# Patient Record
Sex: Female | Born: 1965 | Race: Black or African American | Hispanic: No | Marital: Married | Smoking: Never smoker
Health system: Southern US, Community
[De-identification: ages and names within clinical notes are randomized; demographics above are authoritative.]

---

## 2017-03-24 ENCOUNTER — Other Ambulatory Visit (HOSPITAL_COMMUNITY): Payer: Self-pay | Admitting: Physician Assistant

## 2017-03-24 DIAGNOSIS — H905 Unspecified sensorineural hearing loss: Secondary | ICD-10-CM

## 2017-03-24 DIAGNOSIS — H903 Sensorineural hearing loss, bilateral: Secondary | ICD-10-CM

## 2017-03-30 ENCOUNTER — Ambulatory Visit (HOSPITAL_COMMUNITY)
Admission: RE | Admit: 2017-03-30 | Discharge: 2017-03-30 | Disposition: A | Discharge status: Home / Self Care | Payer: 59 | Source: Ambulatory Visit | Attending: Physician Assistant | Admitting: Physician Assistant

## 2017-03-30 DIAGNOSIS — H905 Unspecified sensorineural hearing loss: Secondary | ICD-10-CM | POA: Insufficient documentation

## 2017-03-30 DIAGNOSIS — H903 Sensorineural hearing loss, bilateral: Secondary | ICD-10-CM

## 2017-03-30 LAB — POCT I-STAT CREATININE: Creatinine, Ser: 0.9 mg/dL (ref 0.44–1.00)

## 2017-03-30 MED ORDER — GADOBENATE DIMEGLUMINE 529 MG/ML IV SOLN
15.0000 mL | Freq: Once | INTRAVENOUS | Status: AC | PRN
  Administered 2017-03-30: 15 mL via INTRAVENOUS

## 2018-11-20 IMAGING — MR MR HEAD WO/W CM
7 of 16 series · 28 of 48 positions shown · IV contrast (multihance)
Comparison: None.

CLINICAL DATA: Abnormal sound left ear 8 weeks duration.

EXAM:
MRI HEAD WITHOUT AND WITH CONTRAST
TECHNIQUE: Multiplanar, multiecho pulse sequences of the brain and surrounding
structures were obtained without and with intravenous contrast.
CONTRAST:  15mL MULTIHANCE GADOBENATE DIMEGLUMINE 529 MG/ML IV SOLN

[Series 3: DWI · axial · 3.0mm · 0.69mm/px · z∈[-137,+23]mm · 5 of 55 slices shown (1 of 4)]
[im 1/55]
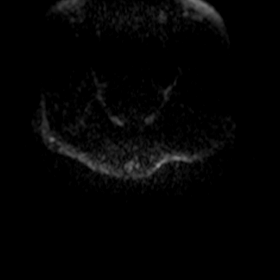
[im 14/55]
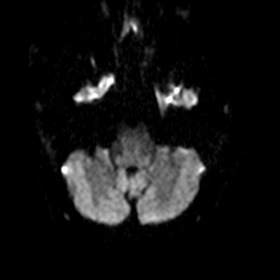
[im 28/55]
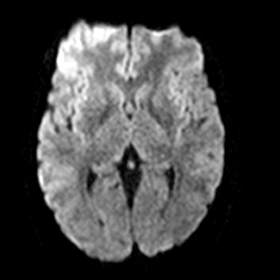
[im 41/55]
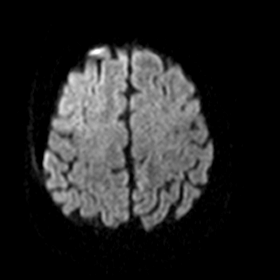
[im 55/55]
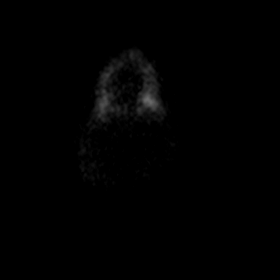

[Series 4: DWI · axial · 3.0mm · 0.73mm/px · z∈[-139,+22]mm · 5 of 55 slices shown (2 of 4)]
[im 1/55]
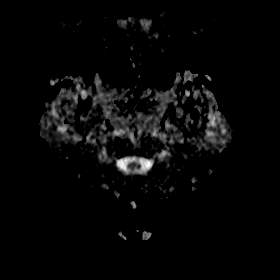
[im 14/55]
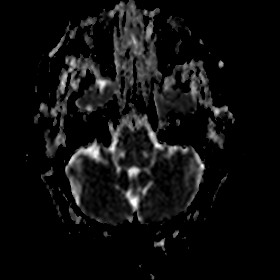
[im 28/55]
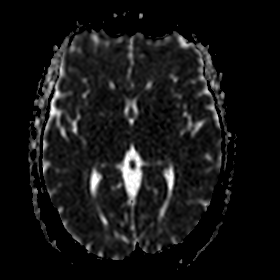
[im 41/55]
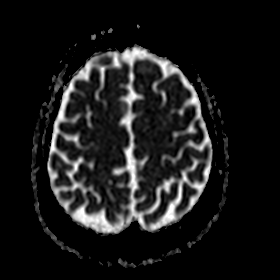
[im 55/55]
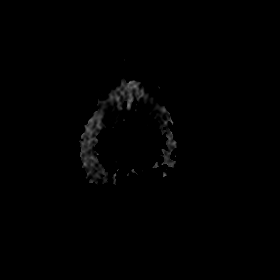

[Series 5: DWI · coronal · 5.0mm · 0.43mm/px · 3 of 34 slices shown (3 of 4)]
[im 1/34]
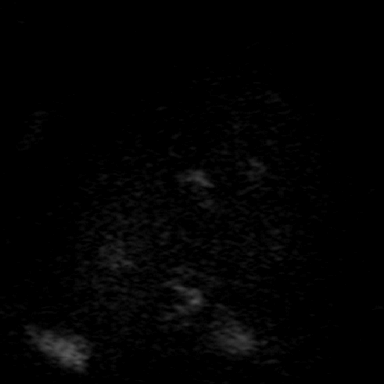
[im 17/34]
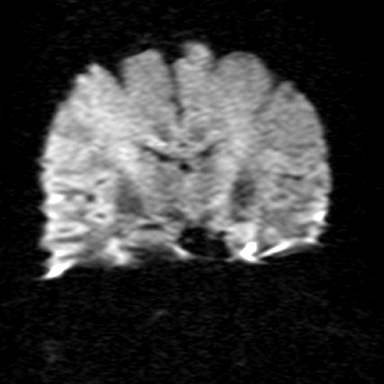
[im 34/34]
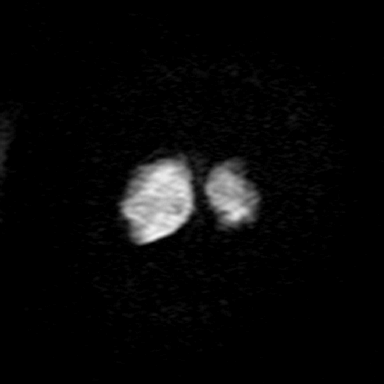

[Series 6: DWI · coronal · 5.0mm · 0.47mm/px · 3 of 34 slices shown (4 of 4)]
[im 1/34]
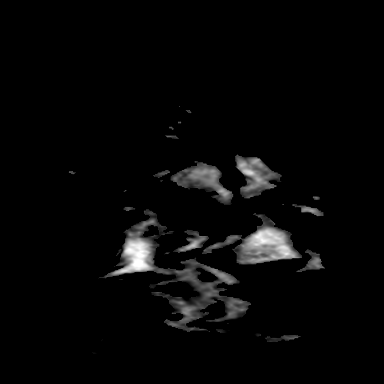
[im 17/34]
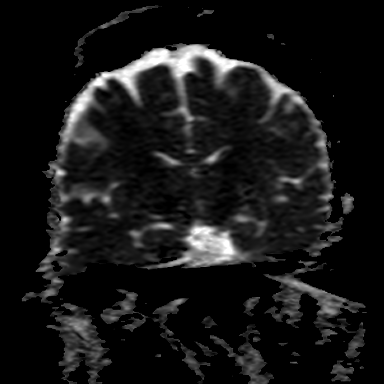
[im 34/34]
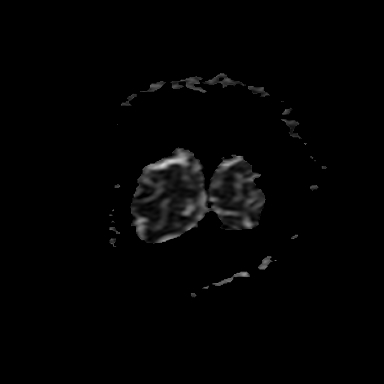

[Series 7: T2 · axial · 5.0mm · 0.41mm/px · 1 of 23 slices shown]
[im 1/23]
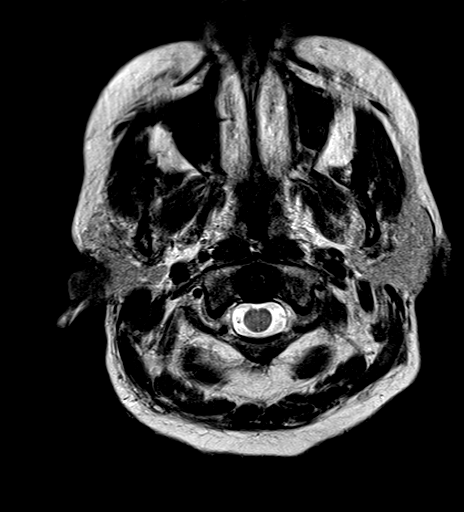

[Series 8: FLAIR · axial · 3.0mm · 0.35mm/px · z∈[-112,+26]mm · 4 of 47 slices shown]
[im 1/47]
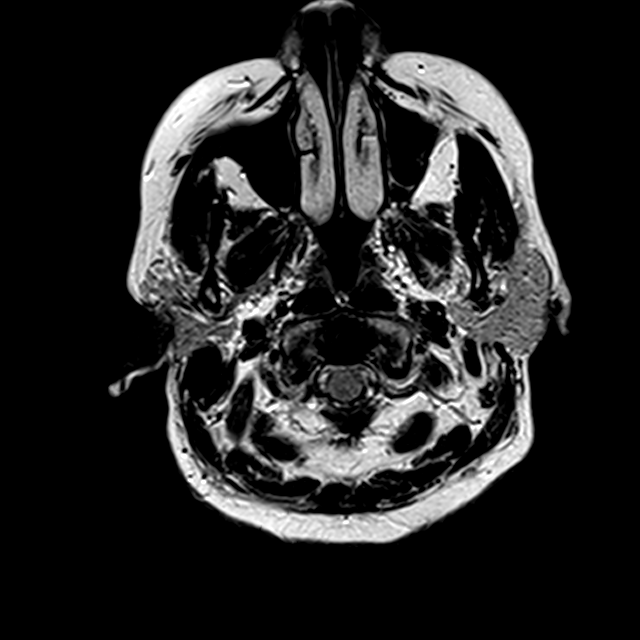
[im 16/47]
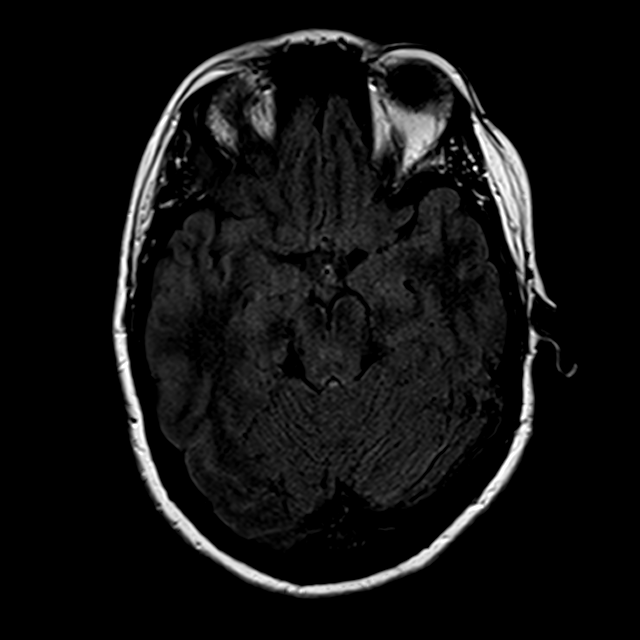
[im 31/47]
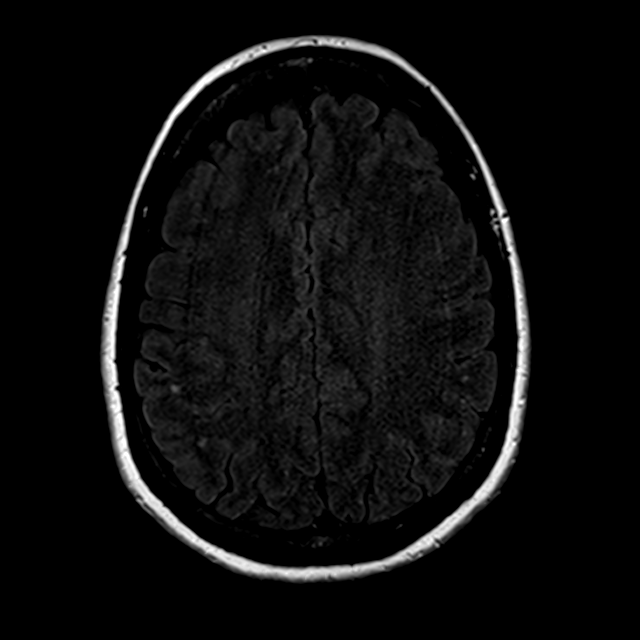
[im 47/47]
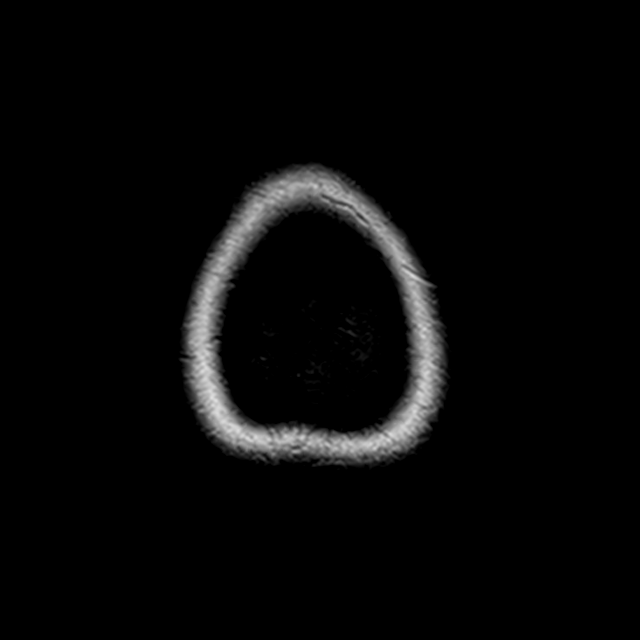

[Series 15: T1 post-contrast · axial · 2.0mm · 0.43mm/px · z∈[-118,+30]mm · 7 of 75 slices shown]
[im 1/75]
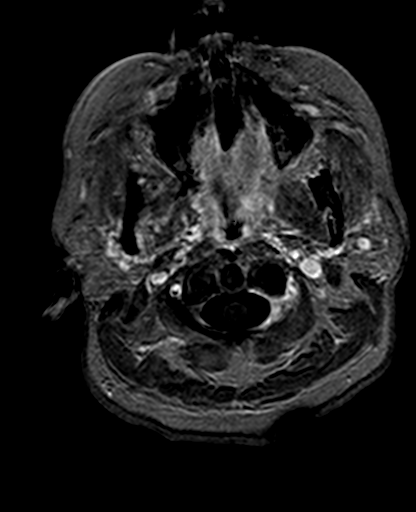
[im 13/75]
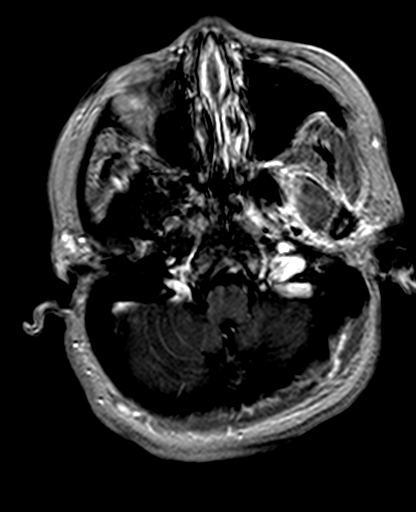
[im 25/75]
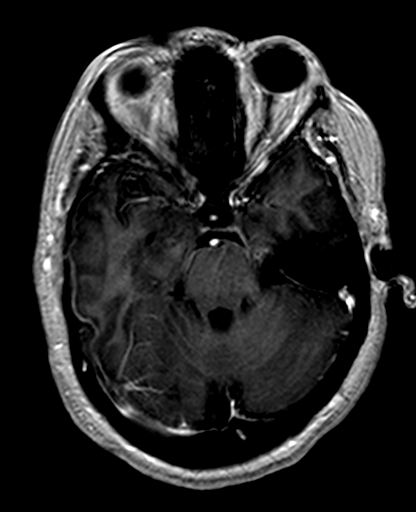
[im 38/75]
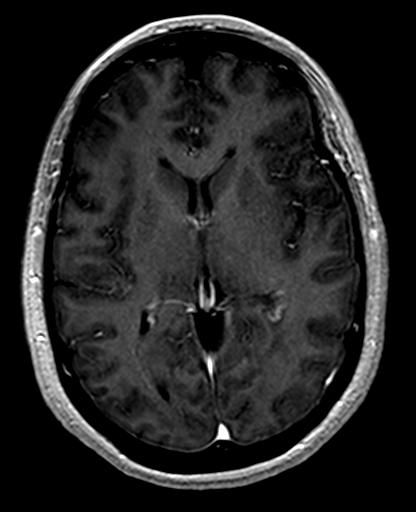
[im 50/75]
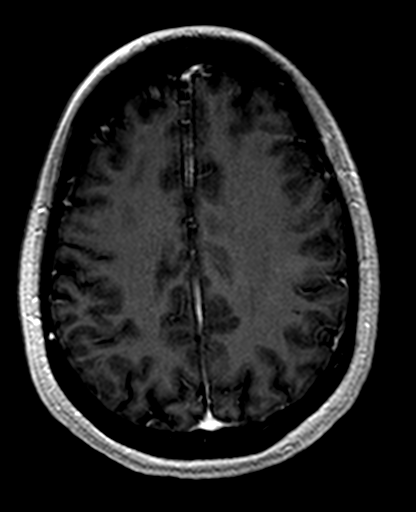
[im 62/75]
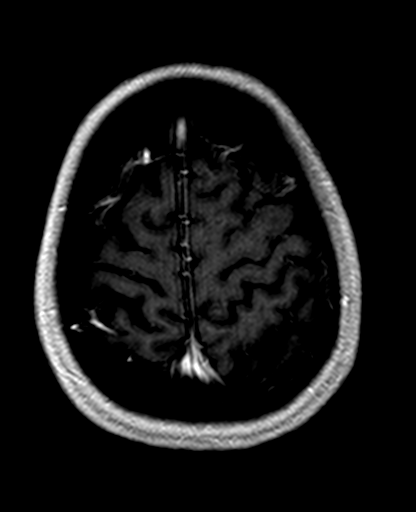
[im 75/75]
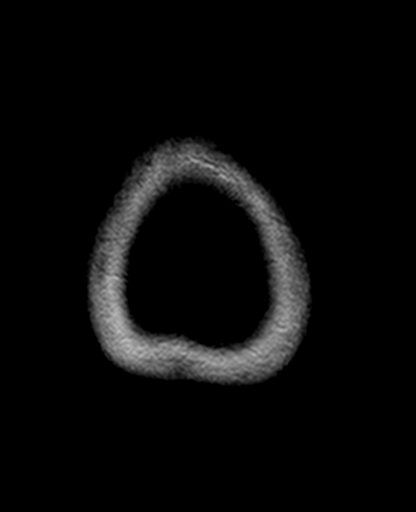

[28 of 48 positions shown; findings below may reference images not displayed]

FINDINGS: Brain: The study suffers from some motion degradation. Allowing for
that, the brain appears normal except for a few punctate foci of T2
and FLAIR signal within the subcortical white matter, probably not
significant. No evidence of generalized atrophy or large vessel
territory insult. No mass lesion, hemorrhage, hydrocephalus or
extra-axial collection. CP angle regions appear normal. Seventh and
eighth nerve complexes appear normal. No vestibular schwannoma. No
enhancing neuritis. The patient does show arachnoid herniation into
the sella with sellar enlargement. Usually, this is a normal
variant, but can be seen in cases of intracranial
hypertension/pseudo tumor.

Vascular: Major vessels at the base of the brain show flow.

Skull and upper cervical spine: Negative

Sinuses/Orbits: Clear sinuses. Orbits negative. No fluid in the
middle ears or mastoids.

Other: None
IMPRESSION: No definite cause of the presenting symptoms is identified. No
vestibular schwannoma. No mastoid or middle ear fluid seen on the
left. Inner ear structures appear within normal limits by MRI.

The patient does have arachnoid herniation into the sella/empty
sella. This is usually a normal variant, but can be seen in cases of
intracranial hypertension/pseudotumor.

## 2019-05-20 ENCOUNTER — Other Ambulatory Visit: Payer: Self-pay

## 2019-05-20 DIAGNOSIS — Z20822 Contact with and (suspected) exposure to covid-19: Secondary | ICD-10-CM

## 2019-05-22 ENCOUNTER — Telehealth: Payer: Self-pay

## 2019-05-22 NOTE — Telephone Encounter (Signed)
Pt called for covid results. Pt informed results are not yet back. Link to enroll to MyChart sent to pt via email.

## 2019-05-23 ENCOUNTER — Telehealth: Payer: Self-pay | Admitting: *Deleted

## 2019-05-23 LAB — NOVEL CORONAVIRUS, NAA: SARS-CoV-2, NAA: NOT DETECTED

## 2019-05-23 NOTE — Telephone Encounter (Signed)
Pt calling to verify covid results; negative. Pt verbalizes understanding.

## 2019-09-02 ENCOUNTER — Other Ambulatory Visit: Payer: Self-pay

## 2019-09-02 ENCOUNTER — Ambulatory Visit: Payer: 59 | Attending: Internal Medicine

## 2019-09-02 DIAGNOSIS — Z23 Encounter for immunization: Secondary | ICD-10-CM | POA: Insufficient documentation

## 2019-09-02 NOTE — Progress Notes (Signed)
   Covid-19 Vaccination Clinic  Name:  Peggy Duarte    MRN: [id number] DOB: [birth year]  09/02/2019  [name] was observed post Covid-19 immunization for 15 minutes without incident. She was provided with Vaccine Information Sheet and instruction to access the V-Safe system.   [name] was instructed to call 911 with any severe reactions post vaccine: Marland Kitchen Difficulty breathing  . Swelling of face and throat  . A fast heartbeat  . A bad rash all over body  . Dizziness and weakness   Immunizations Administered    Name Date Dose VIS Date Route   Moderna COVID-19 Vaccine 09/02/2019  8:43 AM 0.5 mL 05/31/2019 Intramuscular   Manufacturer: Moderna   Lot: [id number]   NDC: 81448-185-63

## 2019-10-04 ENCOUNTER — Ambulatory Visit: Payer: 59 | Attending: Internal Medicine

## 2019-10-04 DIAGNOSIS — Z23 Encounter for immunization: Secondary | ICD-10-CM

## 2019-10-04 NOTE — Progress Notes (Signed)
   Covid-19 Vaccination Clinic  Name:  Peggy Duarte    MRN: [id number] DOB: [birth year]  10/04/2019  [name] was observed post Covid-19 immunization for 15 minutes without incident. She was provided with Vaccine Information Sheet and instruction to access the V-Safe system.   [name] was instructed to call 911 with any severe reactions post vaccine: Marland Kitchen Difficulty breathing  . Swelling of face and throat  . A fast heartbeat  . A bad rash all over body  . Dizziness and weakness   Immunizations Administered    Name Date Dose VIS Date Route   Moderna COVID-19 Vaccine 10/04/2019  9:29 AM 0.5 mL 05/31/2019 Intramuscular   Manufacturer: Moderna   Lot: [id number]   NDC: 74718-550-15

## 2019-11-25 LAB — TSH: TSH: 6.32 — AB (ref <=5.90)

## 2020-01-04 ENCOUNTER — Encounter: Payer: Self-pay | Admitting: Endocrinology

## 2020-01-04 LAB — T4: T4,Free (Direct): 0.9

## 2020-01-04 LAB — TSH: TSH: 5.89

## 2020-01-31 ENCOUNTER — Ambulatory Visit: Payer: 59 | Admitting: Endocrinology

## 2020-02-21 ENCOUNTER — Other Ambulatory Visit: Payer: Self-pay

## 2020-02-21 ENCOUNTER — Ambulatory Visit (INDEPENDENT_AMBULATORY_CARE_PROVIDER_SITE_OTHER): Payer: 59 | Admitting: Endocrinology

## 2020-02-21 ENCOUNTER — Encounter: Payer: Self-pay | Admitting: Endocrinology

## 2020-02-21 DIAGNOSIS — E039 Hypothyroidism, unspecified: Secondary | ICD-10-CM | POA: Diagnosis not present

## 2020-02-21 MED ORDER — LEVOTHYROXINE SODIUM 50 MCG PO TABS: 50.0000 ug | ORAL_TABLET | Freq: Every day | ORAL | 3 refills | Status: DC

## 2020-02-21 NOTE — Progress Notes (Signed)
Subjective:    Patient ID: Peggy Duarte, female    DOB: [birth year], 54 y.o.   MRN: [id number]  HPI Pt is referred by Dr Sherril Croon, for hypothyroidism.  Pt reports hypothyroidism was dx'ed in 2021.  She has been on synthroid since dx.  she has never taken kelp or any other type of non-prescribed thyroid product.  she has never had thyroid imaging.  She has never had thyroid surgery, or XRT to the neck.  she has never been on amiodarone or lithium.  Main symptom is weight gain.   No past medical history on file.   Social History   Socioeconomic History  . Marital status: Married    Spouse name: Not on file  . Number of children: Not on file  . Years of education: Not on file  . Highest education level: Not on file  Occupational History  . Not on file  Tobacco Use  . Smoking status: Never Smoker  . Smokeless tobacco: Never Used  Substance and Sexual Activity  . Alcohol use: Not on file  . Drug use: Not on file  . Sexual activity: Not on file  Other Topics Concern  . Not on file  Social History Narrative  . Not on file   Social Determinants of Health   Financial Resource Strain:   . Difficulty of Paying Living Expenses: Not on file  Food Insecurity:   . Worried About Programme researcher, broadcasting/film/video in the Last Year: Not on file  . Ran Out of Food in the Last Year: Not on file  Transportation Needs:   . Lack of Transportation (Medical): Not on file  . Lack of Transportation (Non-Medical): Not on file  Physical Activity:   . Days of Exercise per Week: Not on file  . Minutes of Exercise per Session: Not on file  Stress:   . Feeling of Stress : Not on file  Social Connections:   . Frequency of Communication with Friends and Family: Not on file  . Frequency of Social Gatherings with Friends and Family: Not on file  . Attends Religious Services: Not on file  . Active Member of Clubs or Organizations: Not on file  . Attends Banker Meetings: Not on file  . Marital  Status: Not on file  Intimate Partner Violence:   . Fear of Current or Ex-Partner: Not on file  . Emotionally Abused: Not on file  . Physically Abused: Not on file  . Sexually Abused: Not on file    Current Outpatient Medications on File Prior to Visit  Medication Sig Dispense Refill  . Vitamin D, Ergocalciferol, (DRISDOL) 1.25 MG (50000 UNIT) CAPS capsule Take 50,000 Units by mouth See admin instructions. Take 3 tablets weekly     No current facility-administered medications on file prior to visit.    No Known Allergies  Family History  Problem Relation Age of Onset  . Thyroid disease Sister     BP 138/80   Pulse 78   Ht 5\' 3"  (1.6 m)   Wt 180 lb 6.4 oz (81.8 kg)   BMI 31.96 kg/m    Review of Systems denies depression, muscle cramps, constipation, numbness, and dry skin.  She has cold intolerance.       Objective:   Physical Exam VS: see vs page GEN: no distress HEAD: head: no deformity eyes: no periorbital swelling, no proptosis external nose and ears are normal NECK: supple, thyroid is not enlarged.   CHEST WALL: no deformity  LUNGS: clear to auscultation CV: reg rate and rhythm, no murmur.  MUSCULOSKELETAL: muscle bulk and strength are grossly normal.  no obvious joint swelling.  gait is normal and steady EXTEMITIES: no deformity.  no leg edema PULSES: no carotid bruit NEURO:  cn 2-12 grossly intact.   readily moves all 4's.  sensation is intact to touch on all 4's SKIN:  Normal texture and temperature.  No rash or suspicious lesion is visible.   NODES:  None palpable at the neck PSYCH: alert, well-oriented.  Does not appear anxious nor depressed.     Lab Results  Component Value Date   TSH 6.32 (A) 11/25/2019   I have reviewed outside records, and summarized: Pt was noted to have elevated TSH, and referred here.  Pt denied sxs.  She was started on synthroid.  Wellness was also addressed.      Assessment & Plan:  Hypothyroidism, new to me.   Uncontrolled.    Patient Instructions  I have sent a prescription to your pharmacy, to increase the thyroid pill. Please redo the blood tests in 1 month, [facility] [place]. Please come back for a follow-up appointment in 6 months.       Hypothyroidism  Hypothyroidism is when the thyroid gland does not make enough of certain hormones (it is underactive). The thyroid gland is a small gland located in the lower front part of the neck, just in front of the windpipe (trachea). This gland makes hormones that help control how the body uses food for energy (metabolism) as well as how the heart and brain function. These hormones also play a role in keeping your bones strong. When the thyroid is underactive, it produces too little of the hormones thyroxine (T4) and triiodothyronine (T3). What are the causes? This condition may be caused by:  Hashimoto's disease. This is a disease in which the body's disease-fighting system (immune system) attacks the thyroid gland. This is the most common cause.  Viral infections.  Pregnancy.  Certain medicines.  Birth defects.  Past radiation treatments to the head or neck for cancer.  Past treatment with radioactive iodine.  Past exposure to radiation in the environment.  Past surgical removal of part or all of the thyroid.  Problems with a gland in the center of the brain (pituitary gland).  Lack of enough iodine in the diet. What increases the risk? You are more likely to develop this condition if:  You are female.  You have a family history of thyroid conditions.  You use a medicine called lithium.  You take medicines that affect the immune system (immunosuppressants). What are the signs or symptoms? Symptoms of this condition include:  Feeling as though you have no energy (lethargy).  Not being able to tolerate cold.  Weight gain that is not explained by a change in diet or exercise habits.  Lack of appetite.  Dry  skin.  Coarse hair.  Menstrual irregularity.  Slowing of thought processes.  Constipation.  Sadness or depression. How is this diagnosed? This condition may be diagnosed based on:  Your symptoms, your medical history, and a physical exam.  Blood tests. You may also have imaging tests, such as an ultrasound or MRI. How is this treated? This condition is treated with medicine that replaces the thyroid hormones that your body does not make. After you begin treatment, it may take several weeks for symptoms to go away. Follow these instructions at home:  Take over-the-counter and prescription medicines only as told by your  health care provider.  If you start taking any new medicines, tell your health care provider.  Keep all follow-up visits as told by your health care provider. This is important. ? As your condition improves, your dosage of thyroid hormone medicine may change. ? You will need to have blood tests regularly so that your health care provider can monitor your condition. Contact a health care provider if:  Your symptoms do not get better with treatment.  You are taking thyroid replacement medicine and you: ? Sweat a lot. ? Have tremors. ? Feel anxious. ? Lose weight rapidly. ? Cannot tolerate heat. ? Have emotional swings. ? Have diarrhea. ? Feel weak. Get help right away if you have:  Chest pain.  An irregular heartbeat.  A rapid heartbeat.  Difficulty breathing. Summary  Hypothyroidism is when the thyroid gland does not make enough of certain hormones (it is underactive).  When the thyroid is underactive, it produces too little of the hormones thyroxine (T4) and triiodothyronine (T3).  The most common cause is Hashimoto's disease, a disease in which the body's disease-fighting system (immune system) attacks the thyroid gland. The condition can also be caused by viral infections, medicine, pregnancy, or past radiation treatment to the head or  neck.  Symptoms may include weight gain, dry skin, constipation, feeling as though you do not have energy, and not being able to tolerate cold.  This condition is treated with medicine to replace the thyroid hormones that your body does not make. This information is not intended to replace advice given to you by your health care provider. Make sure you discuss any questions you have with your health care provider. Document Revised: 05/29/2017 Document Reviewed: 05/27/2017 Elsevier Patient Education  2020 [employer].

## 2020-02-21 NOTE — Patient Instructions (Addendum)
I have sent a prescription to your pharmacy, to increase the thyroid pill.  Please redo the blood tests in 1 month, [facility] [place].  Please come back for a follow-up appointment in 6 months.       Hypothyroidism  Hypothyroidism is when the thyroid gland does not make enough of certain hormones (it is underactive). The thyroid gland is a small gland located in the lower front part of the neck, just in front of the windpipe (trachea). This gland makes hormones that help control how the body uses food for energy (metabolism) as well as how the heart and brain function. These hormones also play a role in keeping your bones strong. When the thyroid is underactive, it produces too little of the hormones thyroxine (T4) and triiodothyronine (T3). What are the causes? This condition may be caused by:  Hashimoto's disease. This is a disease in which the body's disease-fighting system (immune system) attacks the thyroid gland. This is the most common cause.  Viral infections.  Pregnancy.  Certain medicines.  Birth defects.  Past radiation treatments to the head or neck for cancer.  Past treatment with radioactive iodine.  Past exposure to radiation in the environment.  Past surgical removal of part or all of the thyroid.  Problems with a gland in the center of the brain (pituitary gland).  Lack of enough iodine in the diet. What increases the risk? You are more likely to develop this condition if:  You are female.  You have a family history of thyroid conditions.  You use a medicine called lithium.  You take medicines that affect the immune system (immunosuppressants). What are the signs or symptoms? Symptoms of this condition include:  Feeling as though you have no energy (lethargy).  Not being able to tolerate cold.  Weight gain that is not explained by a change in diet or exercise habits.  Lack of appetite.  Dry skin.  Coarse hair.  Menstrual  irregularity.  Slowing of thought processes.  Constipation.  Sadness or depression. How is this diagnosed? This condition may be diagnosed based on:  Your symptoms, your medical history, and a physical exam.  Blood tests. You may also have imaging tests, such as an ultrasound or MRI. How is this treated? This condition is treated with medicine that replaces the thyroid hormones that your body does not make. After you begin treatment, it may take several weeks for symptoms to go away. Follow these instructions at home:  Take over-the-counter and prescription medicines only as told by your health care provider.  If you start taking any new medicines, tell your health care provider.  Keep all follow-up visits as told by your health care provider. This is important. ? As your condition improves, your dosage of thyroid hormone medicine may change. ? You will need to have blood tests regularly so that your health care provider can monitor your condition. Contact a health care provider if:  Your symptoms do not get better with treatment.  You are taking thyroid replacement medicine and you: ? Sweat a lot. ? Have tremors. ? Feel anxious. ? Lose weight rapidly. ? Cannot tolerate heat. ? Have emotional swings. ? Have diarrhea. ? Feel weak. Get help right away if you have:  Chest pain.  An irregular heartbeat.  A rapid heartbeat.  Difficulty breathing. Summary  Hypothyroidism is when the thyroid gland does not make enough of certain hormones (it is underactive).  When the thyroid is underactive, it produces too little of the  hormones thyroxine (T4) and triiodothyronine (T3).  The most common cause is Hashimoto's disease, a disease in which the body's disease-fighting system (immune system) attacks the thyroid gland. The condition can also be caused by viral infections, medicine, pregnancy, or past radiation treatment to the head or neck.  Symptoms may include weight gain,  dry skin, constipation, feeling as though you do not have energy, and not being able to tolerate cold.  This condition is treated with medicine to replace the thyroid hormones that your body does not make. This information is not intended to replace advice given to you by your health care provider. Make sure you discuss any questions you have with your health care provider. Document Revised: 05/29/2017 Document Reviewed: 05/27/2017 Elsevier Patient Education  2020 [employer].

## 2020-03-23 LAB — TSH: TSH: 1.65 u[IU]/mL (ref 0.450–4.500)

## 2020-03-23 LAB — T4, FREE: Free T4: 1.24 ng/dL (ref 0.82–1.77)

## 2020-08-23 ENCOUNTER — Other Ambulatory Visit: Payer: Self-pay

## 2020-08-23 ENCOUNTER — Ambulatory Visit: Payer: 59 | Admitting: Endocrinology

## 2020-08-27 ENCOUNTER — Ambulatory Visit (INDEPENDENT_AMBULATORY_CARE_PROVIDER_SITE_OTHER): Payer: 59 | Admitting: Endocrinology

## 2020-08-27 ENCOUNTER — Other Ambulatory Visit: Payer: Self-pay

## 2020-08-27 VITALS — BP 140/80 | HR 64 | Ht 63.0 in | Wt 181.4 lb

## 2020-08-27 DIAGNOSIS — E039 Hypothyroidism, unspecified: Secondary | ICD-10-CM

## 2020-08-27 LAB — TSH: TSH: 1.31 u[IU]/mL (ref 0.35–4.50)

## 2020-08-27 LAB — T4, FREE: Free T4: 0.75 ng/dL (ref 0.60–1.60)

## 2020-08-27 NOTE — Progress Notes (Signed)
   Subjective:    Patient ID: Peggy Duarte, female    DOB: 1965-12-02, 55 y.o.   MRN: [id number]  HPI Pt returns for f/u of hypothyroidism (dx'ed 2021; she has been on synthroid since dx; she has never had thyroid imaging).  since synthroid was increased, pt states she feels no different, and well in general.  She takes synthroid as rx'ed.    Social History   Socioeconomic History  . Marital status: Married    Spouse name: Not on file  . Number of children: Not on file  . Years of education: Not on file  . Highest education level: Not on file  Occupational History  . Not on file  Tobacco Use  . Smoking status: Never Smoker  . Smokeless tobacco: Never Used  Substance and Sexual Activity  . Alcohol use: Not on file  . Drug use: Not on file  . Sexual activity: Not on file  Other Topics Concern  . Not on file  Social History Narrative  . Not on file   Social Determinants of Health   Financial Resource Strain: Not on file  Food Insecurity: Not on file  Transportation Needs: Not on file  Physical Activity: Not on file  Stress: Not on file  Social Connections: Not on file  Intimate Partner Violence: Not on file    Current Outpatient Medications on File Prior to Visit  Medication Sig Dispense Refill  . levothyroxine (SYNTHROID) 50 MCG tablet Take 1 tablet (50 mcg total) by mouth daily. 90 tablet 3  . Vitamin D, Ergocalciferol, (DRISDOL) 1.25 MG (50000 UNIT) CAPS capsule Take 50,000 Units by mouth See admin instructions. Take 3 tablets weekly     No current facility-administered medications on file prior to visit.    No Known Allergies  Family History  Problem Relation Age of Onset  . Thyroid disease Sister     BP 140/80 (BP Location: Right Arm, Patient Position: Sitting, Cuff Size: Large)   Pulse 64   Ht 5\' 3"  (1.6 m)   Wt 181 lb 6.4 oz (82.3 kg)   SpO2 99%   BMI 32.13 kg/m    Review of Systems No weight change.      Objective:   Physical Exam VITAL  SIGNS:  See vs page GENERAL: no distress NECK: Thyroid is slightly and diffusely enlarged.  No thyroid nodule is palpable.  No palpable lymphadenopathy at the anterior neck.    Lab Results  Component Value Date   TSH 1.31 08/27/2020       Assessment & Plan:  Hypothyroidism: well-replaced.  Please continue the same synthroid.

## 2020-08-27 NOTE — Patient Instructions (Addendum)
Blood tests are requested for you today.  We'll let you know about the results.  It is best to never miss the medication.  However, if you do miss it, next best is to double up the next time.   Please come back for a follow-up appointment in 6 months.   

## 2021-02-25 ENCOUNTER — Ambulatory Visit (INDEPENDENT_AMBULATORY_CARE_PROVIDER_SITE_OTHER): Payer: 59 | Admitting: Endocrinology

## 2021-02-25 ENCOUNTER — Encounter: Payer: Self-pay | Admitting: Endocrinology

## 2021-02-25 ENCOUNTER — Other Ambulatory Visit: Payer: Self-pay

## 2021-02-25 VITALS — BP 132/86 | HR 55 | Ht 63.0 in | Wt 174.4 lb

## 2021-02-25 DIAGNOSIS — E039 Hypothyroidism, unspecified: Secondary | ICD-10-CM

## 2021-02-25 LAB — TSH: TSH: 1.13 u[IU]/mL (ref 0.35–5.50)

## 2021-02-25 LAB — T4, FREE: Free T4: 0.93 ng/dL (ref 0.60–1.60)

## 2021-02-25 NOTE — Patient Instructions (Signed)
Blood tests are requested for you today.  We'll let you know about the results.  It is best to never miss the medication.  However, if you do miss it, next best is to double up the next time.   Please come back for a follow-up appointment in 6 months.   

## 2021-02-25 NOTE — Progress Notes (Signed)
   Subjective:    Patient ID: Peggy Duarte, female    DOB: August 02, 1965, 55 y.o.   MRN: [id number]  HPI Pt returns for f/u of chronic primary hypothyroidism (dx'ed 2021; she has been on synthroid since dx; she has never had thyroid imaging).  since synthroid was increased, pt states she feels no different, and well in general.  She takes synthroid as rx'ed.  Denies neck swelling or lump.   Social History   Socioeconomic History   Marital status: Married    Spouse name: Not on file   Number of children: Not on file   Years of education: Not on file   Highest education level: Not on file  Occupational History   Not on file  Tobacco Use   Smoking status: Never   Smokeless tobacco: Never  Substance and Sexual Activity   Alcohol use: Not on file   Drug use: Not on file   Sexual activity: Not on file  Other Topics Concern   Not on file  Social History Narrative   Not on file   Social Determinants of Health   Financial Resource Strain: Not on file  Food Insecurity: Not on file  Transportation Needs: Not on file  Physical Activity: Not on file  Stress: Not on file  Social Connections: Not on file  Intimate Partner Violence: Not on file    Current Outpatient Medications on File Prior to Visit  Medication Sig Dispense Refill   levothyroxine (SYNTHROID) 50 MCG tablet Take 1 tablet (50 mcg total) by mouth daily. 90 tablet 3   Vitamin D, Ergocalciferol, (DRISDOL) 1.25 MG (50000 UNIT) CAPS capsule Take 50,000 Units by mouth See admin instructions. Take 3 tablets weekly     No current facility-administered medications on file prior to visit.    No Known Allergies  Family History  Problem Relation Age of Onset   Thyroid disease Sister     BP 132/86 (BP Location: Right Arm, Patient Position: Sitting, Cuff Size: Small)   Pulse (!) 55   Ht 5\' 3"  (1.6 m)   Wt 174 lb 6.4 oz (79.1 kg)   SpO2 96%   BMI 30.89 kg/m    Review of Systems     Objective:   Physical  Exam VITAL SIGNS:  See vs page GENERAL: no distress NECK: There is no palpable thyroid enlargement.  No thyroid nodule is palpable.  No palpable lymphadenopathy at the anterior neck.  Lab Results  Component Value Date   TSH 1.13 02/25/2021       Assessment & Plan:  Hypothyroidism: well-controlled.  Please continue the same synthroid.

## 2021-04-24 ENCOUNTER — Telehealth: Payer: Self-pay | Admitting: Endocrinology

## 2021-04-24 ENCOUNTER — Other Ambulatory Visit: Payer: Self-pay

## 2021-04-24 DIAGNOSIS — E039 Hypothyroidism, unspecified: Secondary | ICD-10-CM

## 2021-04-24 MED ORDER — LEVOTHYROXINE SODIUM 50 MCG PO TABS: 50.0000 ug | ORAL_TABLET | Freq: Every day | ORAL | 3 refills | Status: DC

## 2021-04-24 NOTE — Telephone Encounter (Signed)
NEW RX with refills for MEDICATION: levothyroxine (SYNTHROID) 50 MCG tablet  PHARMACY:   [facility] [address] [place] - [address] Phone:  [phone]  Fax:  [phone]      HAS THE PATIENT CONTACTED THEIR PHARMACY?  Yes  IS THIS A 90 DAY SUPPLY : Yes  IS PATIENT OUT OF MEDICATION: Almost  IF NOT; HOW MUCH IS LEFT: Approx 4 days  LAST APPOINTMENT DATE: @8 /29/2022  NEXT APPOINTMENT DATE:@3 /07/2021  DO WE HAVE YOUR PERMISSION TO LEAVE A DETAILED MESSAGE?:Yes  OTHER COMMENTS:    **Let patient know to contact pharmacy at the end of the day to make sure medication is ready. **  ** Please notify patient to allow 48-72 hours to process**  **Encourage patient to contact the pharmacy for refills or they can request refills through [facility]**

## 2021-04-24 NOTE — Telephone Encounter (Signed)
Rx sent 

## 2021-08-29 ENCOUNTER — Ambulatory Visit: Payer: 59 | Admitting: Endocrinology

## 2021-09-24 ENCOUNTER — Ambulatory Visit (INDEPENDENT_AMBULATORY_CARE_PROVIDER_SITE_OTHER): Payer: BC Managed Care – PPO | Admitting: Endocrinology

## 2021-09-24 ENCOUNTER — Encounter: Payer: Self-pay | Admitting: Endocrinology

## 2021-09-24 ENCOUNTER — Ambulatory Visit: Payer: 59 | Admitting: Endocrinology

## 2021-09-24 ENCOUNTER — Other Ambulatory Visit: Payer: Self-pay

## 2021-09-24 VITALS — BP 136/90 | HR 58 | Ht 63.0 in | Wt 173.8 lb

## 2021-09-24 DIAGNOSIS — E039 Hypothyroidism, unspecified: Secondary | ICD-10-CM | POA: Diagnosis not present

## 2021-09-24 LAB — TSH: TSH: 1.76 u[IU]/mL (ref 0.35–5.50)

## 2021-09-24 LAB — T4, FREE: Free T4: 0.8 ng/dL (ref 0.60–1.60)

## 2021-09-24 NOTE — Patient Instructions (Signed)
Blood tests are requested for you today.  We'll let you know about the results.  It is best to never miss the medication.  However, if you do miss it, next best is to double up the next time.   Please come back for a follow-up appointment in 6 months.   

## 2021-09-24 NOTE — Progress Notes (Signed)
? ?  Subjective:  ? ? Patient ID: Peggy Duarte, female    DOB: Feb 26, 1966, 56 y.o.   MRN: [id number] ? ?HPI ?Pt returns for f/u of chronic primary hypothyroidism (dx'ed 2021; she has been on synthroid since dx; she has never had thyroid imaging).  pt states she feels well in general..  She takes synthroid as rx'ed.  Denies neck swelling or lump.   ? ?Social History  ? ?Socioeconomic History  ? Marital status: Married  ?  Spouse name: Not on file  ? Number of children: Not on file  ? Years of education: Not on file  ? Highest education level: Not on file  ?Occupational History  ? Not on file  ?Tobacco Use  ? Smoking status: Never  ? Smokeless tobacco: Never  ?Substance and Sexual Activity  ? Alcohol use: Not on file  ? Drug use: Not on file  ? Sexual activity: Not on file  ?Other Topics Concern  ? Not on file  ?Social History Narrative  ? Not on file  ? ?Social Determinants of Health  ? ?Financial Resource Strain: Not on file  ?Food Insecurity: Not on file  ?Transportation Needs: Not on file  ?Physical Activity: Not on file  ?Stress: Not on file  ?Social Connections: Not on file  ?Intimate Partner Violence: Not on file  ? ? ?Current Outpatient Medications on File Prior to Visit  ?Medication Sig Dispense Refill  ? levothyroxine (SYNTHROID) 50 MCG tablet Take 1 tablet (50 mcg total) by mouth daily. 90 tablet 3  ? Vitamin D, Ergocalciferol, (DRISDOL) 1.25 MG (50000 UNIT) CAPS capsule Take 50,000 Units by mouth See admin instructions. Take 3 tablets weekly    ? ?No current facility-administered medications on file prior to visit.  ? ? ?No Known Allergies ? ?Family History  ?Problem Relation Age of Onset  ? Thyroid disease Sister   ? ? ?BP 136/90 (BP Location: Left Arm, Patient Position: Sitting, Cuff Size: Normal)   Pulse (!) 58   Ht 5\' 3"  (1.6 m)   Wt 173 lb 12.8 oz (78.8 kg)   SpO2 94%   BMI 30.79 kg/m?  ? ? ?Review of Systems ? ?   ?Objective:  ? Physical Exam ?VITAL SIGNS:  See vs page ?GENERAL: no  distress ?NECK: There is no palpable thyroid enlargement.  No thyroid nodule is palpable.  No palpable lymphadenopathy at the anterior neck. ? ? ?Lab Results  ?Component Value Date  ? TSH 1.76 09/24/2021  ? ?   ?Assessment & Plan:  ?Hypothyroidism: well-controlled.  Please continue the same synthroid ? ?

## 2021-09-30 ENCOUNTER — Telehealth: Payer: Self-pay

## 2021-09-30 NOTE — Telephone Encounter (Signed)
Attempted to return patient's call regarding lab results, she answered with noisy back ground. Tried to call again and received no answer. ?

## 2022-03-24 ENCOUNTER — Encounter: Payer: Self-pay | Admitting: Nurse Practitioner

## 2022-03-24 ENCOUNTER — Ambulatory Visit (INDEPENDENT_AMBULATORY_CARE_PROVIDER_SITE_OTHER): Payer: BC Managed Care – PPO | Admitting: Nurse Practitioner

## 2022-03-24 VITALS — BP 147/89 | HR 60 | Ht 63.0 in | Wt 175.0 lb

## 2022-03-24 DIAGNOSIS — E039 Hypothyroidism, unspecified: Secondary | ICD-10-CM

## 2022-03-24 NOTE — Progress Notes (Signed)
Endocrinology Consult Note                                         03/24/2022, 5:10 PM  Subjective:   Subjective    Peggy Duarte is a 56 y.o.-year-old female patient being seen in consultation for hypothyroidism referred by Berenice Primas.   History reviewed. No pertinent past medical history.  History reviewed. No pertinent surgical history.  Social History   Socioeconomic History   Marital status: Married    Spouse name: Not on file   Number of children: Not on file   Years of education: Not on file   Highest education level: Not on file  Occupational History   Not on file  Tobacco Use   Smoking status: Never   Smokeless tobacco: Never  Substance and Sexual Activity   Alcohol use: Not on file   Drug use: Not on file   Sexual activity: Not on file  Other Topics Concern   Not on file  Social History Narrative   Not on file   Social Determinants of Health   Financial Resource Strain: Not on file  Food Insecurity: Not on file  Transportation Needs: Not on file  Physical Activity: Not on file  Stress: Not on file  Social Connections: Not on file    Family History  Problem Relation Age of Onset   Thyroid disease Sister     Outpatient Encounter Medications as of 03/24/2022  Medication Sig   levothyroxine (SYNTHROID) 50 MCG tablet Take 1 tablet (50 mcg total) by mouth daily.   losartan (COZAAR) 25 MG tablet Take 12.5 mg by mouth daily.   Vitamin D, Ergocalciferol, (DRISDOL) 1.25 MG (50000 UNIT) CAPS capsule Take 50,000 Units by mouth See admin instructions. Take 3 tablets weekly Patient states that she is taking 50,000 units weekly.   No facility-administered encounter medications on file as of 03/24/2022.    ALLERGIES: No Known Allergies VACCINATION STATUS: Immunization History  Administered Date(s) Administered   Moderna Sars-Covid-2 Vaccination 09/02/2019, 10/04/2019      HPI   Peggy Duarte  is a patient with the above medical history. she was diagnosed with hypothyroidism at approximate age of 53 years (was previously seeing Dr. Loanne Drilling), which required subsequent initiation of thyroid hormone replacement therapy. she was given various doses of Levothyroxine over the years, currently on 50 micrograms. she reports compliance to this medication:  Taking it daily on empty stomach with water, but she does take it with her blood pressure medication in the morning.  I reviewed patient's thyroid tests:  Lab Results  Component Value Date   TSH 1.76 09/24/2021   TSH 1.13 02/25/2021   TSH 1.31 08/27/2020   TSH 1.650 03/22/2020   TSH 6.32 (A) 11/25/2019   TSH 5.890 10/26/2019   FREET4 0.80 09/24/2021   FREET4 0.93 02/25/2021   FREET4 0.75 08/27/2020   FREET4 1.24 03/22/2020    Pt denies andy overt symptoms of hypothyroidism, feeling nodules in neck, hoarseness, dysphagia/odynophagia,  SOB with lying down.  she does have family history of thyroid disorders in her sister and uncle.  No family history of thyroid cancer.  No history of radiation therapy to head or neck.  No recent use of iodine supplements.  Denies use of Biotin containing supplements.    ROS:  Constitutional: no weight gain/loss, no fatigue, no subjective hyperthermia, no subjective hypothermia Eyes: no blurry vision, no xerophthalmia ENT: no sore throat, no nodules palpated in throat, no dysphagia/odynophagia, no hoarseness Cardiovascular: no chest pain, no SOB, no palpitations, no leg swelling Respiratory: no cough, no SOB Gastrointestinal: no nausea/vomiting/diarrhea Musculoskeletal: no muscle/joint aches Skin: no rashes Neurological: no tremors, no numbness, no tingling, no dizziness Psychiatric: no depression, no anxiety   Objective:   Objective     BP (!) 147/89 (BP Location: Right Arm, Patient Position: Sitting, Cuff Size: Large)   Pulse 60   Ht 5\' 3"  (1.6 m)    Wt 175 lb (79.4 kg)   BMI 31.00 kg/m  Wt Readings from Last 3 Encounters:  03/24/22 175 lb (79.4 kg)  09/24/21 173 lb 12.8 oz (78.8 kg)  02/25/21 174 lb 6.4 oz (79.1 kg)    BP Readings from Last 3 Encounters:  03/24/22 (!) 147/89  09/24/21 136/90  02/25/21 132/86     Constitutional:  Body mass index is 31 kg/m., not in acute distress, normal state of mind Eyes: PERRLA, EOMI, no exophthalmos ENT: moist mucous membranes, no thyromegaly, no cervical lymphadenopathy Cardiovascular: normal precordial activity, RRR, no murmur/rubs/gallops Respiratory:  adequate breathing efforts, no gross chest deformity, Clear to auscultation bilaterally Gastrointestinal: abdomen soft, non-tender, no distension, bowel sounds present Musculoskeletal: no gross deformities, strength intact in all four extremities Skin: moist, warm, no rashes Neurological: no tremor with outstretched hands, deep tendon reflexes normal in BLE.   CMP ( most recent) CMP     Component Value Date/Time   CREATININE 0.90 03/30/2017 0902     Diabetic Labs (most recent): No results found for: "HGBA1C", "MICROALBUR"   Lipid Panel ( most recent) Lipid Panel  No results found for: "CHOL", "TRIG", "HDL", "CHOLHDL", "VLDL", "LDLCALC", "LDLDIRECT", "LABVLDL"     Lab Results  Component Value Date   TSH 1.76 09/24/2021   TSH 1.13 02/25/2021   TSH 1.31 08/27/2020   TSH 1.650 03/22/2020   TSH 6.32 (A) 11/25/2019   TSH 5.890 10/26/2019   FREET4 0.80 09/24/2021   FREET4 0.93 02/25/2021   FREET4 0.75 08/27/2020   FREET4 1.24 03/22/2020      Assessment & Plan:   ASSESSMENT / PLAN:  1. Hypothyroidism-unspecified   Patient with long-standing hypothyroidism, on levothyroxine therapy. On physical exam , patient  does not  have  gross goiter, thyroid nodules, or neck compression symptoms.  She is advised to continue Levothyroxine 50 mcg po daily before breakfast but to separate it from her BP medication.  - We  discussed about correct intake of levothyroxine, at fasting, with water, separated by at least 30 minutes from breakfast, and separated by more than 4 hours from calcium, iron, multivitamins, acid reflux medications (PPIs). -Patient is made aware of the fact that thyroid hormone replacement is needed for life, dose to be adjusted by periodic monitoring of thyroid function tests. - Will check thyroid tests before next visit: TSH, free T4  -Due to absence of clinical goiter, no need for thyroid ultrasound.  - Time spent with the patient: 45 minutes, of which >50% was spent in obtaining information about her symptoms, reviewing her  previous labs, evaluations, and treatments, counseling her about her hypothyroidism, and developing a plan to confirm the diagnosis and long term treatment as necessary. Please refer to "Patient Self Inventory" in the Media tab for reviewed elements of pertinent patient history.  Kara Dies participated in the discussions, expressed understanding, and voiced agreement with the above plans.  All questions were answered to her satisfaction. she is encouraged to contact clinic should she have any questions or concerns prior to her return visit.   FOLLOW UP PLAN:  Return in about 4 months (around 07/24/2022) for Thyroid follow up, Previsit today and again prior to next visit.Rayetta Pigg, FNP-BC [facility] [address] [place],  [address] Phone: [phone] Fax: [phone]  03/24/2022, 5:10 PM

## 2022-03-24 NOTE — Patient Instructions (Signed)

## 2022-03-25 LAB — TSH: TSH: 1.62 u[IU]/mL (ref 0.450–4.500)

## 2022-03-25 LAB — T4, FREE: Free T4: 1.42 ng/dL (ref 0.82–1.77)

## 2022-03-25 LAB — THYROID PEROXIDASE ANTIBODY: Thyroperoxidase Ab SerPl-aCnc: 12 IU/mL (ref 0–34)

## 2022-03-25 LAB — THYROGLOBULIN ANTIBODY: Thyroglobulin Antibody: <1 IU/mL (ref 0.0–0.9)

## 2022-03-25 NOTE — Progress Notes (Signed)
Patient was called and given her results per Whitney Reardon's .

## 2022-03-25 NOTE — Progress Notes (Signed)
Patient was called and given her results per Whitney Reardon, NP 

## 2022-03-25 NOTE — Progress Notes (Signed)
Please call her and tell her that her thyroid function tests are consistent with proper hormone replacement.  I recommend she stay on the same dose of Levothyroxine 50 mcg po daily before breakfast.  Her antibody testing was negative, ruling out autoimmune thyroid dysfunction, which we will discuss in further detail at next follow up.

## 2022-04-30 ENCOUNTER — Other Ambulatory Visit: Payer: Self-pay

## 2022-04-30 DIAGNOSIS — E039 Hypothyroidism, unspecified: Secondary | ICD-10-CM

## 2022-04-30 MED ORDER — LEVOTHYROXINE SODIUM 50 MCG PO TABS: 50.0000 ug | ORAL_TABLET | Freq: Every day | ORAL | 0 refills | Status: DC

## 2022-07-22 NOTE — Patient Instructions (Incomplete)

## 2022-07-24 ENCOUNTER — Ambulatory Visit: Payer: BC Managed Care – PPO | Admitting: Nurse Practitioner

## 2022-07-24 ENCOUNTER — Telehealth: Payer: Self-pay | Admitting: Nurse Practitioner

## 2022-07-24 DIAGNOSIS — E039 Hypothyroidism, unspecified: Secondary | ICD-10-CM

## 2022-07-24 MED ORDER — LEVOTHYROXINE SODIUM 50 MCG PO TABS: 50.0000 ug | ORAL_TABLET | Freq: Every day | ORAL | 0 refills | Status: AC

## 2022-07-24 NOTE — Telephone Encounter (Signed)
I can give her limited refill, just because we may change her dose depending on her blood work.

## 2022-07-24 NOTE — Telephone Encounter (Signed)
Pt came in for visit and was unaware she needed bloodwork.  Pt asking for a refill on her levothyroxine to [facility].  New Appt 08/20/22.00

## 2022-08-19 NOTE — Patient Instructions (Incomplete)

## 2022-08-20 ENCOUNTER — Ambulatory Visit: Payer: BC Managed Care – PPO | Admitting: Nurse Practitioner
